# Patient Record
Sex: Female | Born: 1965 | Race: White | Hispanic: No | Marital: Married | Smoking: Never smoker
Health system: Southern US, Community
[De-identification: ages and names within clinical notes are randomized; demographics above are authoritative.]

---

## 2011-09-02 DIAGNOSIS — L309 Dermatitis, unspecified: Secondary | ICD-10-CM | POA: Insufficient documentation

## 2011-10-28 DIAGNOSIS — Q667 Congenital pes cavus, unspecified foot: Secondary | ICD-10-CM | POA: Insufficient documentation

## 2011-10-28 DIAGNOSIS — R87612 Low grade squamous intraepithelial lesion on cytologic smear of cervix (LGSIL): Secondary | ICD-10-CM | POA: Insufficient documentation

## 2011-10-28 DIAGNOSIS — B070 Plantar wart: Secondary | ICD-10-CM | POA: Insufficient documentation

## 2011-10-28 DIAGNOSIS — L988 Other specified disorders of the skin and subcutaneous tissue: Secondary | ICD-10-CM | POA: Insufficient documentation

## 2011-10-29 DIAGNOSIS — N393 Stress incontinence (female) (male): Secondary | ICD-10-CM | POA: Insufficient documentation

## 2012-04-07 ENCOUNTER — Emergency Department: Payer: Self-pay | Admitting: Emergency Medicine

## 2013-07-16 IMAGING — CR LEFT WRIST - COMPLETE 3+ VIEW
1 series · 4 of 4 positions shown · non-contrast
Comparison: none

REASON FOR EXAM: fall, pain
COMMENTS:

PROCEDURE:     DXR - DXR WRIST LT COMP WITH OBLIQUES  - April 07, 2012  [DATE]
RESULT:
The bones are osteopenic. A nondisplaced fracture is identified along the
dorsal aspect of the radial styloid.

[Series 1: x wrist pa left · 0.14mm/px · 4 of 4 slices shown]
[im 1/4]
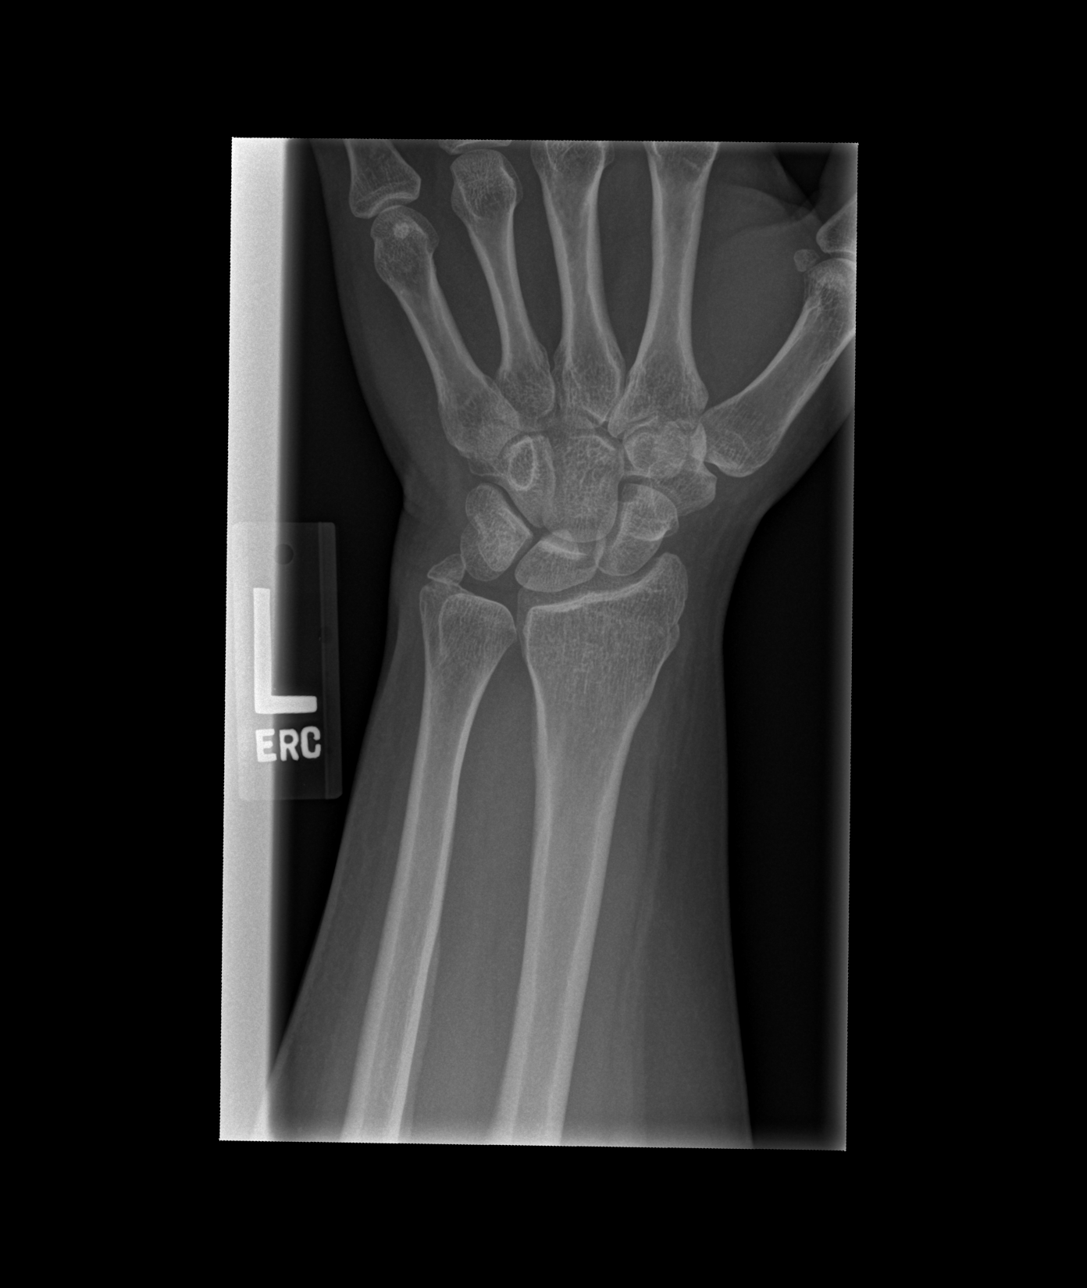
[im 2/4]
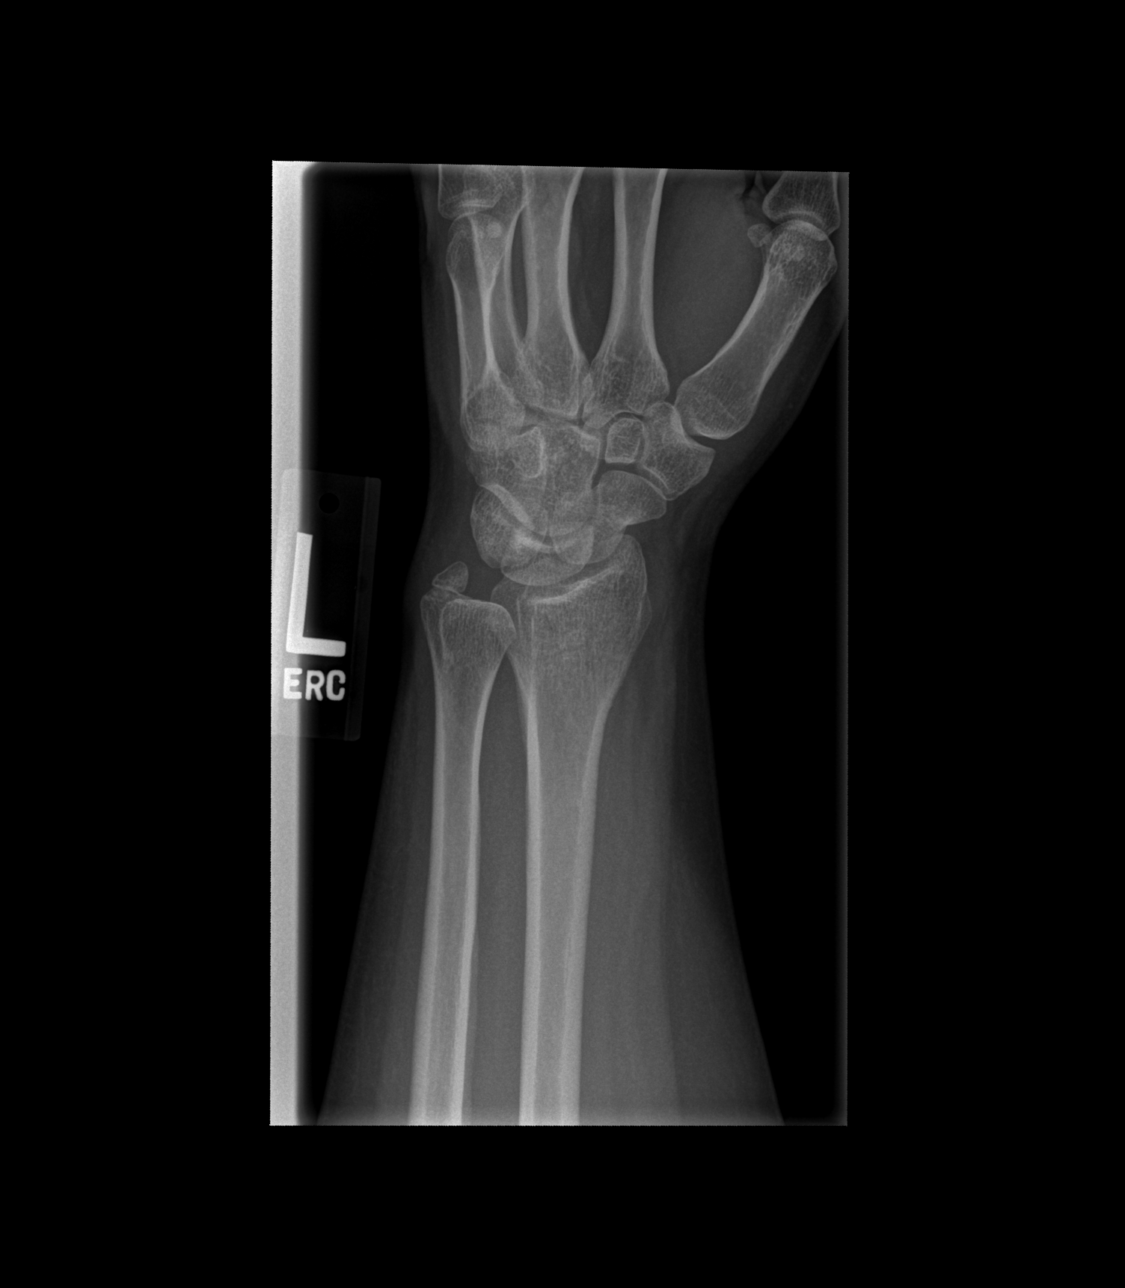
[im 3/4]
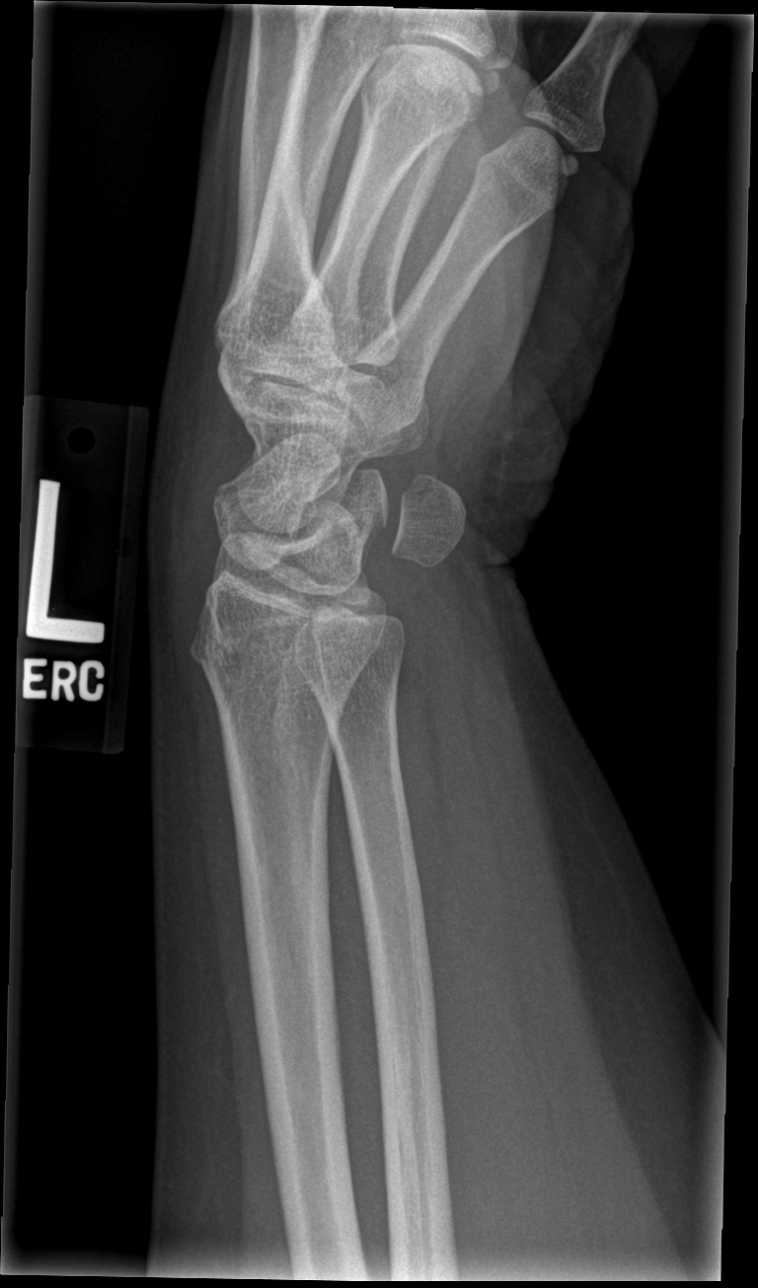
[im 4/4]
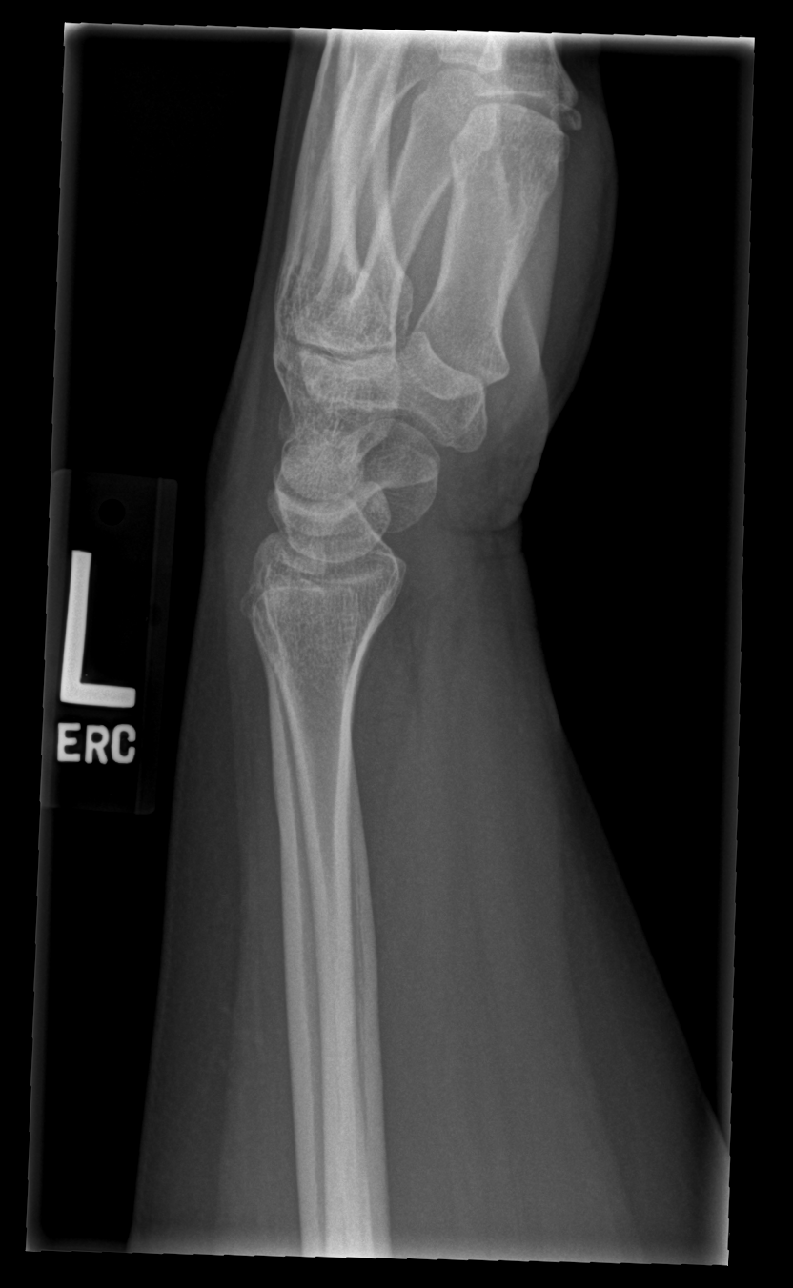

[4 of 4 positions shown; findings below may reference images not displayed]

IMPRESSION: 1. Acute radial styloid fracture.
2. Chronic ulnar styloid fracture is appreciated.

## 2014-12-05 DIAGNOSIS — N200 Calculus of kidney: Secondary | ICD-10-CM | POA: Insufficient documentation

## 2017-01-01 DIAGNOSIS — D126 Benign neoplasm of colon, unspecified: Secondary | ICD-10-CM | POA: Insufficient documentation

## 2017-11-11 ENCOUNTER — Ambulatory Visit
Admission: EM | Admit: 2017-11-11 | Discharge: 2017-11-11 | Disposition: A | Discharge status: Other Acute Inpatient Hospital | Payer: BC Managed Care – PPO | Attending: Family Medicine | Admitting: Family Medicine

## 2017-11-11 DIAGNOSIS — M79651 Pain in right thigh: Secondary | ICD-10-CM

## 2017-11-11 DIAGNOSIS — I8001 Phlebitis and thrombophlebitis of superficial vessels of right lower extremity: Secondary | ICD-10-CM

## 2017-11-11 DIAGNOSIS — M79661 Pain in right lower leg: Secondary | ICD-10-CM

## 2017-11-11 NOTE — Discharge Instructions (Addendum)
Recommend patient go to Emergency Department for further evaluation and management (rule out DVT)

## 2017-11-11 NOTE — ED Triage Notes (Signed)
Pt here for right leg pain that started a week ago. Has a lump, redness and some varicose veins that is getting worse. Thinks it may be a burst blood vessel and wanted to rule out that it's a blood clot. Does have swelling in both legs and has been doing warm compressions on the right leg and does work on her feet a lot. States her right leg is painful.
